# Patient Record
Sex: Male | Born: 1965 | Race: White | Hispanic: No | Marital: Married | Smoking: Never smoker
Health system: Southern US, Community
[De-identification: ages and names within clinical notes are randomized; demographics above are authoritative.]

## PROBLEM LIST (undated history)

## (undated) DIAGNOSIS — Z889 Allergy status to unspecified drugs, medicaments and biological substances status: Secondary | ICD-10-CM

## (undated) DIAGNOSIS — E785 Hyperlipidemia, unspecified: Secondary | ICD-10-CM

## (undated) DIAGNOSIS — I10 Essential (primary) hypertension: Secondary | ICD-10-CM

## (undated) DIAGNOSIS — K219 Gastro-esophageal reflux disease without esophagitis: Secondary | ICD-10-CM

## (undated) HISTORY — PX: HAND SURGERY: SHX662

---

## 2014-09-25 ENCOUNTER — Other Ambulatory Visit: Payer: Self-pay | Admitting: Gastroenterology

## 2014-11-12 ENCOUNTER — Other Ambulatory Visit: Payer: Self-pay | Admitting: Gastroenterology

## 2014-11-25 ENCOUNTER — Encounter (HOSPITAL_COMMUNITY): Admission: RE | Payer: Self-pay | Source: Ambulatory Visit

## 2014-11-25 ENCOUNTER — Ambulatory Visit (HOSPITAL_COMMUNITY)
Admission: RE | Admit: 2014-11-25 | Payer: BLUE CROSS/BLUE SHIELD | Source: Ambulatory Visit | Admitting: Gastroenterology

## 2014-11-25 SURGERY — COLONOSCOPY WITH PROPOFOL
Anesthesia: Monitor Anesthesia Care

## 2015-01-29 ENCOUNTER — Other Ambulatory Visit: Payer: Self-pay | Admitting: Gastroenterology

## 2015-03-05 ENCOUNTER — Encounter (HOSPITAL_COMMUNITY): Payer: Self-pay | Admitting: *Deleted

## 2015-03-18 ENCOUNTER — Ambulatory Visit (HOSPITAL_COMMUNITY): Payer: BLUE CROSS/BLUE SHIELD | Admitting: Anesthesiology

## 2015-03-18 ENCOUNTER — Encounter (HOSPITAL_COMMUNITY): Payer: Self-pay

## 2015-03-18 ENCOUNTER — Ambulatory Visit (HOSPITAL_COMMUNITY)
Admission: RE | Admit: 2015-03-18 | Discharge: 2015-03-18 | Disposition: A | Discharge status: Home / Self Care | Payer: BLUE CROSS/BLUE SHIELD | Source: Ambulatory Visit | Attending: Gastroenterology | Admitting: Gastroenterology

## 2015-03-18 ENCOUNTER — Encounter (HOSPITAL_COMMUNITY)
Admission: RE | Disposition: A | Discharge status: Home / Self Care | Payer: Self-pay | Source: Ambulatory Visit | Attending: Gastroenterology

## 2015-03-18 DIAGNOSIS — E7800 Pure hypercholesterolemia, unspecified: Secondary | ICD-10-CM | POA: Diagnosis not present

## 2015-03-18 DIAGNOSIS — I10 Essential (primary) hypertension: Secondary | ICD-10-CM | POA: Diagnosis not present

## 2015-03-18 DIAGNOSIS — K5730 Diverticulosis of large intestine without perforation or abscess without bleeding: Secondary | ICD-10-CM | POA: Diagnosis not present

## 2015-03-18 DIAGNOSIS — Z1211 Encounter for screening for malignant neoplasm of colon: Secondary | ICD-10-CM | POA: Insufficient documentation

## 2015-03-18 HISTORY — DX: Essential (primary) hypertension: I10

## 2015-03-18 HISTORY — PX: COLONOSCOPY WITH PROPOFOL: SHX5780

## 2015-03-18 HISTORY — DX: Allergy status to unspecified drugs, medicaments and biological substances: Z88.9

## 2015-03-18 SURGERY — COLONOSCOPY WITH PROPOFOL
Anesthesia: Monitor Anesthesia Care

## 2015-03-18 MED ORDER — ONDANSETRON HCL 4 MG/2ML IJ SOLN
INTRAMUSCULAR | Status: DC | PRN
  Administered 2015-03-18: 4 mg via INTRAVENOUS

## 2015-03-18 MED ORDER — ONDANSETRON HCL 4 MG/2ML IJ SOLN
INTRAMUSCULAR | Status: AC
  Filled 2015-03-18: qty 2

## 2015-03-18 MED ORDER — PROPOFOL 10 MG/ML IV BOLUS
INTRAVENOUS | Status: AC
Start: 2015-03-18 — End: 2015-03-18
  Filled 2015-03-18: qty 20

## 2015-03-18 MED ORDER — LIDOCAINE HCL (CARDIAC) 20 MG/ML IV SOLN
INTRAVENOUS | Status: AC
  Filled 2015-03-18: qty 5

## 2015-03-18 MED ORDER — SODIUM CHLORIDE 0.9 % IV SOLN: INTRAVENOUS | Status: DC

## 2015-03-18 MED ORDER — PROPOFOL 10 MG/ML IV BOLUS
INTRAVENOUS | Status: AC
  Filled 2015-03-18: qty 40

## 2015-03-18 MED ORDER — LACTATED RINGERS IV SOLN
INTRAVENOUS | Status: DC
  Administered 2015-03-18: 1000 mL via INTRAVENOUS

## 2015-03-18 MED ORDER — PROPOFOL 500 MG/50ML IV EMUL
INTRAVENOUS | Status: DC | PRN
  Administered 2015-03-18: 175 ug/kg/min via INTRAVENOUS

## 2015-03-18 SURGICAL SUPPLY — 21 items

## 2015-03-18 NOTE — Discharge Instructions (Signed)
Colonoscopy, Care After °Refer to this sheet in the next few weeks. These instructions provide you with information on caring for yourself after your procedure. Your health care provider may also give you more specific instructions. Your treatment has been planned according to current medical practices, but problems sometimes occur. Call your health care provider if you have any problems or questions after your procedure. °WHAT TO EXPECT AFTER THE PROCEDURE  °After your procedure, it is typical to have the following: °· A small amount of blood in your stool. °· Moderate amounts of gas and mild abdominal cramping or bloating. °HOME CARE INSTRUCTIONS °· Do not drive, operate machinery, or sign important documents for 24 hours. °· You may shower and resume your regular physical activities, but move at a slower pace for the first 24 hours. °· Take frequent rest periods for the first 24 hours. °· Walk around or put a warm pack on your abdomen to help reduce abdominal cramping and bloating. °· Drink enough fluids to keep your urine clear or pale yellow. °· You may resume your normal diet as instructed by your health care provider. Avoid heavy or fried foods that are hard to digest. °· Avoid drinking alcohol for 24 hours or as instructed by your health care provider. °· Only take over-the-counter or prescription medicines as directed by your health care provider. °· If a tissue sample (biopsy) was taken during your procedure: °¨ Do not take aspirin or blood thinners for 7 days, or as instructed by your health care provider. °¨ Do not drink alcohol for 7 days, or as instructed by your health care provider. °¨ Eat soft foods for the first 24 hours. °SEEK MEDICAL CARE IF: °You have persistent spotting of blood in your stool 2-3 days after the procedure. °SEEK IMMEDIATE MEDICAL CARE IF: °· You have more than a small spotting of blood in your stool. °· You pass large blood clots in your stool. °· Your abdomen is swollen  (distended). °· You have nausea or vomiting. °· You have a fever. °· You have increasing abdominal pain that is not relieved with medicine. °  °This information is not intended to replace advice given to you by your health care provider. Make sure you discuss any questions you have with your health care provider. °  °Document Released: 08/19/2003 Document Revised: 10/25/2012 Document Reviewed: 09/11/2012 °Elsevier Interactive Patient Education ©2016 [employer] ° °

## 2015-03-18 NOTE — Op Note (Signed)
Procedure: Screening colonoscopy  Endoscopist: Danise Edge  Premedication: Propofol administered by anesthesia  Procedure: The patient was placed in the left lateral decubitus position. Anal inspection and digital rectal exam were normal. The Pentax pediatric colonoscope was introduced into the rectum and advanced to the cecum. A normal-appearing appendiceal orifice and ileocecal valve were identified. Colonic preparation exam today was good. Withdrawal time was 12 minutes  Rectum. Normal. Retroflexed view of the distal rectum was normal  Sigmoid colon and descending colon. Colonic diverticulosis  Splenic flexure. Normal  Transverse colon. Normal  Hepatic flexure. Normal  Ascending colon. Normal  Cecum and ileocecal valve. Normal  Assessment: Normal baseline screening colonoscopy  Recommendation: Schedule repeat screening colonoscopy in 10 years

## 2015-03-18 NOTE — Anesthesia Preprocedure Evaluation (Addendum)
Anesthesia Evaluation  Patient identified by MRN, date of birth, ID band Patient awake    Reviewed: Allergy & Precautions, NPO status , Patient's Chart, lab work & pertinent test results  History of Anesthesia Complications Negative for: history of anesthetic complications  Airway Mallampati: II  TM Distance: >3 FB Neck ROM: Full    Dental  (+) Dental Advisory Given   Pulmonary neg pulmonary ROS,    breath sounds clear to auscultation       Cardiovascular hypertension, Pt. on medications (-) angina Rhythm:Regular Rate:Normal     Neuro/Psych negative neurological ROS     GI/Hepatic negative GI ROS, Neg liver ROS,   Endo/Other  negative endocrine ROS  Renal/GU negative Renal ROS     Musculoskeletal negative musculoskeletal ROS (+)   Abdominal   Peds  Hematology negative hematology ROS (+)   Anesthesia Other Findings   Reproductive/Obstetrics                           Anesthesia Physical Anesthesia Plan  ASA: II  Anesthesia Plan: MAC   Post-op Pain Management:    Induction: Intravenous  Airway Management Planned: Natural Airway and Simple Face Mask  Additional Equipment:   Intra-op Plan:   Post-operative Plan:   Informed Consent: I have reviewed the patients History and Physical, chart, labs and discussed the procedure including the risks, benefits and alternatives for the proposed anesthesia with the patient or authorized representative who has indicated his/her understanding and acceptance.   Dental advisory given  Plan Discussed with: CRNA and Surgeon  Anesthesia Plan Comments: (Plan routine monitors, MAC)        Anesthesia Quick Evaluation

## 2015-03-18 NOTE — Transfer of Care (Signed)
Immediate Anesthesia Transfer of Care Note  Patient: Tracy Goodwin  Procedure(s) Performed: Procedure(s): COLONOSCOPY WITH PROPOFOL (N/A)  Patient Location: PACU  Anesthesia Type:MAC  Level of Consciousness: awake, alert  and patient cooperative  Airway & Oxygen Therapy: Patient Spontanous Breathing  Post-op Assessment: Report given to RN  Post vital signs: Reviewed and stable  Last Vitals:  Filed Vitals:   03/18/15 0903 03/18/15 1116  BP: 150/92 135/85  Pulse: 81 98  Resp: 11 24    Complications: No apparent anesthesia complications

## 2015-03-18 NOTE — Anesthesia Postprocedure Evaluation (Signed)
Anesthesia Post Note  Patient: Tracy Goodwin  Procedure(s) Performed: Procedure(s) (LRB): COLONOSCOPY WITH PROPOFOL (N/A)  Patient location during evaluation: Endoscopy Anesthesia Type: MAC Level of consciousness: awake and alert, oriented and patient cooperative Pain management: pain level controlled Vital Signs Assessment: post-procedure vital signs reviewed and stable Respiratory status: spontaneous breathing, nonlabored ventilation and respiratory function stable Cardiovascular status: blood pressure returned to baseline and stable Postop Assessment: no signs of nausea or vomiting Anesthetic complications: no    Last Vitals:  Filed Vitals:   03/18/15 1120 03/18/15 1130  BP: 138/114 156/94  Pulse:    Temp:    Resp:      Last Pain: There were no vitals filed for this visit.               Germaine Pomfret

## 2015-03-18 NOTE — H&P (Signed)
  Procedure: Baseline screening colonoscopy. No family history of colon cancer.  History: The patient is a 50 year old male born Jan 09, 1966. He is scheduled to undergo a baseline screening colonoscopy  Past medical history: Hypertension. Hypercholesterolemia.  Medication allergies: None  Exam: The patient is alert and lying comfortably on the endoscopy stretcher. Abdomen is soft and nontender to palpation. Lungs are clear to auscultation. Cardiac exam reveals a regular rhythm.  Plan: Proceed with screening colonoscopy

## 2015-03-19 ENCOUNTER — Encounter (HOSPITAL_COMMUNITY): Payer: Self-pay | Admitting: Gastroenterology

## 2015-06-25 DIAGNOSIS — Z125 Encounter for screening for malignant neoplasm of prostate: Secondary | ICD-10-CM | POA: Diagnosis not present

## 2015-06-25 DIAGNOSIS — Z0000 Encounter for general adult medical examination without abnormal findings: Secondary | ICD-10-CM | POA: Diagnosis not present

## 2015-06-25 DIAGNOSIS — Z1389 Encounter for screening for other disorder: Secondary | ICD-10-CM | POA: Diagnosis not present

## 2015-08-30 DIAGNOSIS — R079 Chest pain, unspecified: Secondary | ICD-10-CM | POA: Diagnosis not present

## 2015-12-25 DIAGNOSIS — E782 Mixed hyperlipidemia: Secondary | ICD-10-CM | POA: Diagnosis not present

## 2015-12-25 DIAGNOSIS — K219 Gastro-esophageal reflux disease without esophagitis: Secondary | ICD-10-CM | POA: Diagnosis not present

## 2015-12-25 DIAGNOSIS — J309 Allergic rhinitis, unspecified: Secondary | ICD-10-CM | POA: Diagnosis not present

## 2015-12-25 DIAGNOSIS — I10 Essential (primary) hypertension: Secondary | ICD-10-CM | POA: Diagnosis not present

## 2016-04-27 ENCOUNTER — Emergency Department (HOSPITAL_COMMUNITY): Payer: BLUE CROSS/BLUE SHIELD

## 2016-04-27 ENCOUNTER — Encounter (HOSPITAL_COMMUNITY): Payer: Self-pay | Admitting: Cardiology

## 2016-04-27 ENCOUNTER — Emergency Department (HOSPITAL_COMMUNITY)
Admission: EM | Admit: 2016-04-27 | Discharge: 2016-04-28 | Disposition: A | Discharge status: Home / Self Care | Payer: BLUE CROSS/BLUE SHIELD | Attending: Emergency Medicine | Admitting: Emergency Medicine

## 2016-04-27 DIAGNOSIS — Z7982 Long term (current) use of aspirin: Secondary | ICD-10-CM | POA: Insufficient documentation

## 2016-04-27 DIAGNOSIS — E876 Hypokalemia: Secondary | ICD-10-CM | POA: Diagnosis not present

## 2016-04-27 DIAGNOSIS — R079 Chest pain, unspecified: Secondary | ICD-10-CM | POA: Insufficient documentation

## 2016-04-27 DIAGNOSIS — Z79899 Other long term (current) drug therapy: Secondary | ICD-10-CM | POA: Insufficient documentation

## 2016-04-27 DIAGNOSIS — R0602 Shortness of breath: Secondary | ICD-10-CM | POA: Diagnosis not present

## 2016-04-27 DIAGNOSIS — I10 Essential (primary) hypertension: Secondary | ICD-10-CM | POA: Diagnosis not present

## 2016-04-27 DIAGNOSIS — R208 Other disturbances of skin sensation: Secondary | ICD-10-CM

## 2016-04-27 DIAGNOSIS — R200 Anesthesia of skin: Secondary | ICD-10-CM

## 2016-04-27 DIAGNOSIS — R0600 Dyspnea, unspecified: Secondary | ICD-10-CM

## 2016-04-27 HISTORY — DX: Hyperlipidemia, unspecified: E78.5

## 2016-04-27 LAB — CBC
HCT: 44 % (ref 39.0–52.0)
Hemoglobin: 15.2 g/dL (ref 13.0–17.0)
MCH: 33.6 pg (ref 26.0–34.0)
MCHC: 34.5 g/dL (ref 30.0–36.0)
MCV: 97.3 fL (ref 78.0–100.0)
Platelets: 192 10*3/uL (ref 150–400)
RBC: 4.52 MIL/uL (ref 4.22–5.81)
RDW: 12.7 % (ref 11.5–15.5)
WBC: 8.5 10*3/uL (ref 4.0–10.5)

## 2016-04-27 LAB — TROPONIN I: Troponin I: <0.03 ng/mL (ref <0.03)

## 2016-04-27 LAB — APTT: APTT: 28 s (ref 24–36)

## 2016-04-27 LAB — BASIC METABOLIC PANEL
ANION GAP: 9 (ref 5–15)
BUN: 11 mg/dL (ref 6–20)
CO2: 25 mmol/L (ref 22–32)
CREATININE: 0.88 mg/dL (ref 0.61–1.24)
Calcium: 9.5 mg/dL (ref 8.9–10.3)
Chloride: 101 mmol/L (ref 101–111)
GFR calc Af Amer: >60 mL/min (ref >60)
GFR calc non Af Amer: >60 mL/min (ref >60)
Glucose, Bld: 104 mg/dL — ABNORMAL HIGH (ref 65–99)
Potassium: 3.3 mmol/L — ABNORMAL LOW (ref 3.5–5.1)
SODIUM: 135 mmol/L (ref 135–145)

## 2016-04-27 LAB — PROTIME-INR
INR: 0.99
Prothrombin Time: 13.1 seconds (ref 11.4–15.2)

## 2016-04-27 NOTE — ED Triage Notes (Addendum)
Numbness in left since last night.  Shortness of breath for a while.  c/o some off and on left arm pain.  Denies any at this time.  Worse today.

## 2016-04-27 NOTE — ED Provider Notes (Addendum)
[facility]Y DEPT Provider Note   CSN: [id number] Arrival date & time: 04/27/16  1829    By signing my name below, I, Tracy Goodwin, attest that this documentation has been prepared under the direction and in the presence of Tracy Albe, MD. Electronically Signed: [name], ED Scribe. 04/27/16. 11:41 PM.  Time Seen: 23:26   History   Chief Complaint Chief Complaint  Patient presents with  . Shortness of Breath   The history is provided by the patient and a relative. No language interpreter was used.   HPI Comments: Tracy Goodwin is a 51 y.o. male with PMHx of HTN and hyperlipidemia who presents to the Emergency Department complaining of moderate, constant, outer left-foot numbness onset ~8pm last night. Pt states his numbness radiates up to his left ankle. He describes this sensation as if his foot were asleep. He denies having trouble ambulating. Pt also complains of episodic SOB accompanied with chest soreness that occurred earlier today. Pt states his episodes typically last  ~a minute at a time. It is sore to touch and with movement. He notes his SOB is exacerbated with bending over. He denies having numbness in his left foot digits, he denies tingling. Per pt's sister, she states pt appeared a "gray color to the face" earlier today. Per nurse note, pt complained of left arm pain but states it has subsided. He had it at 6 pm and it lasted 30 minutes.  No alleviating factors noted. Per pt, he reports FMHx of strokes on paternal side (grandfather 62's) and maternal side (aunt 66). Pt denies neck pain, back pain, headache, blurred vision, wheezing, cough, fever. Pt is not a smoker. He notes being a moderate consumer of alcohol, typically drinks 2 drinks per day.  Pt is right handed.   PCP- Dr. Georgann Housekeeper   Past Medical History:  Diagnosis Date  . Hx of seasonal allergies   . Hyperlipidemia   . Hypertension     There are no active problems to display for this  patient.   Past Surgical History:  Procedure Laterality Date  . COLONOSCOPY WITH PROPOFOL N/A 03/18/2015   Procedure: COLONOSCOPY WITH PROPOFOL;  Surgeon: Charolett Bumpers, MD;  Location: [facility];  Service: Endoscopy;  Laterality: N/A;  . HAND SURGERY Left    repair tendon"cut repair"       Home Medications    Prior to Admission medications   Medication Sig Start Date End Date Taking? Authorizing Provider  amLODipine (NORVASC) 5 MG tablet Take 5 mg by mouth daily.   Yes Historical Provider, MD  aspirin EC 81 MG tablet Take 81 mg by mouth daily.   Yes Historical Provider, MD  losartan (COZAAR) 50 MG tablet Take 50 mg by mouth daily.   Yes Historical Provider, MD  pantoprazole (PROTONIX) 40 MG tablet Take 40 mg by mouth daily. 03/19/16  Yes Historical Provider, MD  simvastatin (ZOCOR) 20 MG tablet Take 20 mg by mouth daily at 6 PM.   Yes Historical Provider, MD  potassium chloride 20 MEQ TBCR Take 20 mEq by mouth 2 (two) times daily. 04/28/16 05/03/16  Tracy Albe, MD    Family History History reviewed. No pertinent family history.  Social History Social History  Substance Use Topics  . Smoking status: Never Smoker  . Smokeless tobacco: Not on file  . Alcohol use Yes     Comment: occ.  employed   Allergies   Patient has no known allergies.   Review of Systems Review of Systems  Constitutional: Negative for fever.  Eyes: Negative for visual disturbance.  Respiratory: Positive for shortness of breath. Negative for cough and wheezing.   Cardiovascular: Positive for chest pain (soreness).  Musculoskeletal: Negative for back pain and neck pain.  Neurological: Positive for numbness. Negative for headaches.  All other systems reviewed and are negative.    Physical Exam Updated Vital Signs BP (!) 155/94 (BP Location: Left Arm)   Pulse 99   Temp 98.6 F (37 C) (Oral)   Resp 16   Ht  (1.778 m)   Wt 185 lb (83.9 kg)   SpO2 97%   BMI 26.54 kg/m   Vital signs  normal except hypertension, borderline tachycardia   Physical Exam  Constitutional: He is oriented to person, place, and time. He appears well-developed and well-nourished.  Non-toxic appearance. He does not appear ill. No distress.  HENT:  Head: Normocephalic and atraumatic.  Right Ear: External ear normal.  Left Ear: External ear normal.  Nose: Nose normal. No mucosal edema or rhinorrhea.  Mouth/Throat: Oropharynx is clear and moist and mucous membranes are normal. No dental abscesses or uvula swelling.  Eyes: Conjunctivae and EOM are normal. Pupils are equal, round, and reactive to light.  Neck: Normal range of motion and full passive range of motion without pain. Neck supple.  Cardiovascular: Normal rate, regular rhythm and normal heart sounds.  Exam reveals no gallop and no friction rub.   No murmur heard. Pulmonary/Chest: Effort normal and breath sounds normal. No respiratory distress. He has no wheezes. He has no rhonchi. He has no rales. He exhibits no tenderness and no crepitus.  Abdominal: Soft. Normal appearance and bowel sounds are normal. He exhibits no distension. There is no tenderness. There is no rebound and no guarding.  Musculoskeletal: Normal range of motion. He exhibits no edema or tenderness.  Moves all extremities well.   Neurological: He is alert and oriented to person, place, and time. He has normal strength. No cranial nerve deficit.  Grips are equal. No pronator drift. No lower extremity weakness. Has subjective decreased sensation to light touch just to the lateral left foot but not the toes, medial foot or ankles.    His left hand is cold to touch compared to right with some decreased radial pulse compared tot the right. But he has good pulses in both feet. And there is no temperature difference in both deet.   Skin: Skin is warm, dry and intact. No rash noted. No erythema. No pallor.  Psychiatric: He has a normal mood and affect. His speech is normal and  behavior is normal. His mood appears not anxious.  Nursing note and vitals reviewed.    ED Treatments / Results   DIAGNOSTIC STUDIES: Oxygen Saturation is 97% on RA, normal by my interpretation.    Labs (all labs ordered are listed, but only abnormal results are displayed) Results for orders placed or performed during the hospital encounter of 04/27/16  Basic metabolic panel  Result Value Ref Range   Sodium 135 135 - 145 mmol/L   Potassium 3.3 (L) 3.5 - 5.1 mmol/L   Chloride 101 101 - 111 mmol/L   CO2 25 22 - 32 mmol/L   Glucose, Bld 104 (H) 65 - 99 mg/dL   BUN 11 6 - 20 mg/dL   Creatinine, Ser 9.14 0.61 - 1.24 mg/dL   Calcium 9.5 8.9 - 78.2 mg/dL   GFR calc non Af Amer >60 >60 mL/min   GFR calc Af Amer >60 >60  mL/min   Anion gap 9 5 - 15  CBC  Result Value Ref Range   WBC 8.5 4.0 - 10.5 K/uL   RBC 4.52 4.22 - 5.81 MIL/uL   Hemoglobin 15.2 13.0 - 17.0 g/dL   HCT 16.1 09.6 - 04.5 %   MCV 97.3 78.0 - 100.0 fL   MCH 33.6 26.0 - 34.0 pg   MCHC 34.5 30.0 - 36.0 g/dL   RDW 40.9 81.1 - 91.4 %   Platelets 192 150 - 400 K/uL  Troponin I  Result Value Ref Range   Troponin I <0.03 <0.03 ng/mL  Ethanol  Result Value Ref Range   Alcohol, Ethyl (B) <5 <5 mg/dL  Protime-INR  Result Value Ref Range   Prothrombin Time 13.1 11.4 - 15.2 seconds   INR 0.99   APTT  Result Value Ref Range   aPTT 28 24 - 36 seconds  Urine rapid drug screen (hosp performed)not at [facility]  Result Value Ref Range   Opiates NONE DETECTED NONE DETECTED   Cocaine NONE DETECTED NONE DETECTED   Benzodiazepines NONE DETECTED NONE DETECTED   Amphetamines NONE DETECTED NONE DETECTED   Tetrahydrocannabinol NONE DETECTED NONE DETECTED   Barbiturates NONE DETECTED NONE DETECTED  Urinalysis, Routine w reflex microscopic  Result Value Ref Range   Color, Urine YELLOW YELLOW   APPearance CLEAR CLEAR   Specific Gravity, Urine 1.013 1.005 - 1.030   pH 7.0 5.0 - 8.0   Glucose, UA NEGATIVE NEGATIVE mg/dL   Hgb urine  dipstick NEGATIVE NEGATIVE   Bilirubin Urine NEGATIVE NEGATIVE   Ketones, ur 80 (A) NEGATIVE mg/dL   Protein, ur NEGATIVE NEGATIVE mg/dL   Nitrite NEGATIVE NEGATIVE   Leukocytes, UA NEGATIVE NEGATIVE   Laboratory interpretation all normal except mild hypokalemia    EKG  EKG Interpretation  Date/Time:  Tuesday April 27 2016 18:43:29 EDT Ventricular Rate:  88 PR Interval:  134 QRS Duration: 92 QT Interval:  382 QTC Calculation: 462 R Axis:   -6 Text Interpretation:  Normal sinus rhythm Minimal voltage criteria for LVH, may be normal variant Nonspecific ST abnormality Abnormal ECG No STEMI. No old tracing for comparison.  Confirmed by LONG MD, JOSHUA [phone]) on 04/27/2016 6:53:48 PM       EKG Interpretation  Date/Time:  Tuesday April 27 2016 23:41:06 EDT Ventricular Rate:  88 PR Interval:  134 QRS Duration: 105 QT Interval:  379 QTC Calculation: 459 R Axis:   18 Text Interpretation:  Sinus rhythm Borderline T wave abnormalities Baseline wander No significant change since last tracing EARLIER SAME DATE Confirmed by Sigurd Pugh  MD-I, Argie Lober ([id number]) on 04/28/2016 7:41:55 AM        Radiology Dg Chest 2 View  Result Date: 04/27/2016 CLINICAL DATA:  Mid to left-sided chest numbness.  Left arm pain. EXAM: CHEST  2 VIEW COMPARISON:  None. FINDINGS: Heart size is normal. Mediastinal shadows are normal. There is mild scarring at the left base/ lingula. Remainder the chest is clear. No effusions. No significant bone finding. IMPRESSION: No active disease.  Mild scarring left lung base/ lingula. Electronically Signed   By: Paulina Fusi M.D.   On: 04/27/2016 19:29   Ct Head Wo Contrast  Result Date: 04/28/2016 CLINICAL DATA:  Acute onset of left ankle numbness. Initial encounter. EXAM: CT HEAD WITHOUT CONTRAST TECHNIQUE: Contiguous axial images were obtained from the base of the skull through the vertex without intravenous contrast. COMPARISON:  None. FINDINGS: Brain: No evidence of acute  infarction, hemorrhage, hydrocephalus, extra-axial  collection or mass lesion/mass effect. The posterior fossa, including the cerebellum, brainstem and fourth ventricle, is within normal limits. The third and lateral ventricles, and basal ganglia are unremarkable in appearance. The cerebral hemispheres are symmetric in appearance, with normal gray-white differentiation. No mass effect or midline shift is seen. Vascular: No hyperdense vessel or unexpected calcification. Skull: There is no evidence of fracture; visualized osseous structures are unremarkable in appearance. Sinuses/Orbits: The visualized portions of the orbits are within normal limits. The paranasal sinuses and mastoid air cells are well-aerated. Other: No significant soft tissue abnormalities are seen. IMPRESSION: Unremarkable noncontrast CT of the head. Electronically Signed   By: Roanna Raider M.D.   On: 04/28/2016 00:33    Procedures Procedures (including critical care time)  Medications Ordered in ED Medications - No data to display   Initial Impression / Assessment and Plan / ED Course  I have reviewed the triage vital signs and the nursing notes.  Pertinent labs & imaging results that were available during my care of the patient were reviewed by me and considered in my medical decision making (see chart for details).  COORDINATION OF CARE: 11:35 PM Discussed treatment plan with pt at bedside which includes labs, chest imaging, EKG and pt agreed to plan.    03:47 AM pt states the numbness is gone. We discussed his test results. He will be sent home with potassium supplement and to follow up with his PCP. Etiology of his numbness is unclear. At first I thought he may have had a lacunar infarct which could cause localized numbness. He denies any pain in his back to suggest a radiculopathy. He can be further evaluated by his PCP.  Final Clinical Impressions(s) / ED Diagnoses   Final diagnoses:  Numbness of left foot  Dyspnea,  unspecified type  Hypokalemia    New Prescriptions Discharge Medication List as of 04/28/2016  4:21 AM    START taking these medications   Details  potassium chloride 20 MEQ TBCR Take 20 mEq by mouth 2 (two) times daily., Starting Wed 04/28/2016, Until Mon 05/03/2016, Print        Plan discharge  Tracy Albe, MD, FACEP  I personally performed the services described in this documentation, which was scribed in my presence. The recorded information has been reviewed and considered.  Tracy Albe, MD, Concha Pyo, MD 04/28/16 [id number]    Tracy Albe, MD 04/28/16 [phone]

## 2016-04-28 DIAGNOSIS — R200 Anesthesia of skin: Secondary | ICD-10-CM | POA: Diagnosis not present

## 2016-04-28 LAB — URINALYSIS, ROUTINE W REFLEX MICROSCOPIC
Bilirubin Urine: NEGATIVE
Glucose, UA: NEGATIVE mg/dL
HGB URINE DIPSTICK: NEGATIVE
Ketones, ur: 80 mg/dL — AB
Leukocytes, UA: NEGATIVE
Nitrite: NEGATIVE
PH: 7 (ref 5.0–8.0)
Protein, ur: NEGATIVE mg/dL
SPECIFIC GRAVITY, URINE: 1.013 (ref 1.005–1.030)

## 2016-04-28 LAB — RAPID URINE DRUG SCREEN, HOSP PERFORMED
AMPHETAMINES: NOT DETECTED
BENZODIAZEPINES: NOT DETECTED
Barbiturates: NOT DETECTED
Cocaine: NOT DETECTED
OPIATES: NOT DETECTED
Tetrahydrocannabinol: NOT DETECTED

## 2016-04-28 LAB — ETHANOL

## 2016-04-28 MED ORDER — POTASSIUM CHLORIDE ER 20 MEQ PO TBCR: 20.0000 meq | EXTENDED_RELEASE_TABLET | Freq: Two times a day (BID) | ORAL | 0 refills | Status: DC

## 2016-04-28 NOTE — Discharge Instructions (Signed)
Let Dr Venita Sheffield office know about your ED visit tonight. Return if you get the numbness again.  Recheck if you get chest pain or struggle to breathe. Take the potassium pills until gone.

## 2016-06-28 DIAGNOSIS — E782 Mixed hyperlipidemia: Secondary | ICD-10-CM | POA: Diagnosis not present

## 2016-06-28 DIAGNOSIS — Z1159 Encounter for screening for other viral diseases: Secondary | ICD-10-CM | POA: Diagnosis not present

## 2016-06-28 DIAGNOSIS — R7309 Other abnormal glucose: Secondary | ICD-10-CM | POA: Diagnosis not present

## 2016-06-28 DIAGNOSIS — Z125 Encounter for screening for malignant neoplasm of prostate: Secondary | ICD-10-CM | POA: Diagnosis not present

## 2016-06-28 DIAGNOSIS — Z1389 Encounter for screening for other disorder: Secondary | ICD-10-CM | POA: Diagnosis not present

## 2016-06-28 DIAGNOSIS — Z0000 Encounter for general adult medical examination without abnormal findings: Secondary | ICD-10-CM | POA: Diagnosis not present

## 2016-06-28 DIAGNOSIS — I10 Essential (primary) hypertension: Secondary | ICD-10-CM | POA: Diagnosis not present

## 2016-12-29 DIAGNOSIS — Z23 Encounter for immunization: Secondary | ICD-10-CM | POA: Diagnosis not present

## 2016-12-29 DIAGNOSIS — K219 Gastro-esophageal reflux disease without esophagitis: Secondary | ICD-10-CM | POA: Diagnosis not present

## 2016-12-29 DIAGNOSIS — I10 Essential (primary) hypertension: Secondary | ICD-10-CM | POA: Diagnosis not present

## 2016-12-29 DIAGNOSIS — E782 Mixed hyperlipidemia: Secondary | ICD-10-CM | POA: Diagnosis not present

## 2016-12-29 DIAGNOSIS — J309 Allergic rhinitis, unspecified: Secondary | ICD-10-CM | POA: Diagnosis not present

## 2017-06-29 DIAGNOSIS — K219 Gastro-esophageal reflux disease without esophagitis: Secondary | ICD-10-CM | POA: Diagnosis not present

## 2017-06-29 DIAGNOSIS — E782 Mixed hyperlipidemia: Secondary | ICD-10-CM | POA: Diagnosis not present

## 2017-06-29 DIAGNOSIS — Z1389 Encounter for screening for other disorder: Secondary | ICD-10-CM | POA: Diagnosis not present

## 2017-06-29 DIAGNOSIS — Z0000 Encounter for general adult medical examination without abnormal findings: Secondary | ICD-10-CM | POA: Diagnosis not present

## 2017-06-29 DIAGNOSIS — I10 Essential (primary) hypertension: Secondary | ICD-10-CM | POA: Diagnosis not present

## 2017-08-01 DIAGNOSIS — D72825 Bandemia: Secondary | ICD-10-CM | POA: Diagnosis not present

## 2017-12-28 DIAGNOSIS — K219 Gastro-esophageal reflux disease without esophagitis: Secondary | ICD-10-CM | POA: Diagnosis not present

## 2017-12-28 DIAGNOSIS — Z23 Encounter for immunization: Secondary | ICD-10-CM | POA: Diagnosis not present

## 2017-12-28 DIAGNOSIS — J309 Allergic rhinitis, unspecified: Secondary | ICD-10-CM | POA: Diagnosis not present

## 2017-12-28 DIAGNOSIS — I10 Essential (primary) hypertension: Secondary | ICD-10-CM | POA: Diagnosis not present

## 2017-12-28 DIAGNOSIS — E782 Mixed hyperlipidemia: Secondary | ICD-10-CM | POA: Diagnosis not present

## 2018-07-07 DIAGNOSIS — Z125 Encounter for screening for malignant neoplasm of prostate: Secondary | ICD-10-CM | POA: Diagnosis not present

## 2018-07-07 DIAGNOSIS — E782 Mixed hyperlipidemia: Secondary | ICD-10-CM | POA: Diagnosis not present

## 2018-07-07 DIAGNOSIS — Z1389 Encounter for screening for other disorder: Secondary | ICD-10-CM | POA: Diagnosis not present

## 2018-07-07 DIAGNOSIS — Z0000 Encounter for general adult medical examination without abnormal findings: Secondary | ICD-10-CM | POA: Diagnosis not present

## 2018-07-07 DIAGNOSIS — I10 Essential (primary) hypertension: Secondary | ICD-10-CM | POA: Diagnosis not present

## 2020-03-03 ENCOUNTER — Other Ambulatory Visit (HOSPITAL_COMMUNITY): Payer: Self-pay | Admitting: Family Medicine

## 2020-03-03 DIAGNOSIS — M7989 Other specified soft tissue disorders: Secondary | ICD-10-CM

## 2020-03-03 DIAGNOSIS — M79661 Pain in right lower leg: Secondary | ICD-10-CM

## 2020-03-04 ENCOUNTER — Other Ambulatory Visit: Payer: Self-pay

## 2020-03-04 ENCOUNTER — Ambulatory Visit (HOSPITAL_COMMUNITY)
Admission: RE | Admit: 2020-03-04 | Discharge: 2020-03-04 | Disposition: A | Discharge status: Home / Self Care | Payer: 59 | Source: Ambulatory Visit | Attending: Family Medicine | Admitting: Family Medicine

## 2020-03-04 DIAGNOSIS — M7989 Other specified soft tissue disorders: Secondary | ICD-10-CM | POA: Diagnosis present

## 2020-03-04 DIAGNOSIS — M79661 Pain in right lower leg: Secondary | ICD-10-CM | POA: Diagnosis not present

## 2020-11-10 ENCOUNTER — Other Ambulatory Visit (HOSPITAL_COMMUNITY): Payer: Self-pay | Admitting: Family Medicine

## 2020-11-10 ENCOUNTER — Other Ambulatory Visit: Payer: Self-pay | Admitting: Family Medicine

## 2020-11-10 DIAGNOSIS — M5416 Radiculopathy, lumbar region: Secondary | ICD-10-CM

## 2020-11-11 NOTE — H&P (Signed)
Surgical History & Physical  Patient Name: Tracy Goodwin DOB: 11-27-1965  Surgery: Cataract extraction with intraocular lens implant phacoemulsification; Right Eye  Surgeon: Fabio Pierce MD Surgery Date:  11-13-20 Pre-Op Date:  11-03-20  HPI: [name] is referred by Nedra Hai for cataract eval. 1. 1. The patient complains of difficulty when viewing TV, reading closed caption, news scrolls on TV, which began 5 years ago. The right eye is affected. The episode is gradual. The condition's severity increased since last visit. Symptoms occur when the patient is inside and outside. This is negatively affecting the patient's quality of life. Hx of eye trauma when he was at 7th grade, no eye sx. HPI Completed by Dr. Fabio Pierce  Medical History: High Blood Pressure LDL  Review of Systems Negative Allergic/Immunologic Negative Cardiovascular Negative Constitutional Negative Ear, Nose, Mouth & Throat Negative Endocrine Negative Eyes Negative Gastrointestinal Negative Genitourinary Negative Hemotologic/Lymphatic Negative Integumentary Negative Musculoskeletal Negative Neurological Negative Psychiatry Negative Respiratory  Social   Never smoked   Medication Amlodipine besylate, Losartan Potassium, Pantoprazole, Garlic, Potassium chloride, Simvastatin,   Sx/Procedures  None  Drug Allergies   NKDA  History & Physical: Heent: Cataract, Right Eye NECK: supple without bruits LUNGS: lungs clear to auscultation CV: regular rate and rhythm Abdomen: soft and non-tender  Impression & Plan: Assessment: 1.  COMBINED FORMS AGE RELATED CATARACT; Right Eye (H25.811) 2.  BLEPHARITIS; Right Upper Lid, Right Lower Lid, Left Upper Lid, Left Lower Lid (H01.001, H01.002,H01.004,H01.005) 3.  Pinguecula; Both Eyes (H11.153) 4.  NUCLEAR SCLEROSIS AGE RELATED; Left Eye (H25.12)  Plan: 1.  Cataract accounts for the patient's decreased vision. This visual impairment is not  correctable with a tolerable change in glasses or contact lenses. Cataract surgery with an implantation of a new lens should significantly improve the visual and functional status of the patient. Discussed all risks, benefits, alternatives, and potential complications. Discussed the procedures and recovery. Patient desires to have surgery. A-scan ordered and performed today for intra-ocular lens calculations. The surgery will be performed in order to improve vision for driving, reading, and for eye examinations. Recommend phacoemulsification with intra-ocular lens. Recommend Dextenza for post-operative pain and inflammation. Right Eye. Dilates well - shugarcaine by protocol. History of trauma. Dense cortical - Vision Blue. Consider Vivity lens.  2.  Recommend regular lid cleaning.  3.  Observe; Artificial tears as needed for irritation.  4.  Cataract accounts for the patient's decreased vision. This visual impairment is not correctable with a tolerable change in glasses or contact lenses. Cataract surgery with an implantation of a new lens should significantly improve the visual and functional status of the patient. Discussed all risks, benefits, alternatives, and potential complications. Discussed the procedures and recovery. Patient desires to have surgery. A-scan ordered and performed today for intra-ocular lens calculations. The surgery will be performed in order to improve vision for driving, reading, and for eye examinations. Recommend phacoemulsification with intra-ocular lens. Recommend Dextenza for post-operative pain and inflammation.

## 2020-11-12 ENCOUNTER — Other Ambulatory Visit: Payer: Self-pay

## 2020-11-12 ENCOUNTER — Encounter (HOSPITAL_COMMUNITY)
Admission: RE | Admit: 2020-11-12 | Discharge: 2020-11-12 | Disposition: A | Discharge status: Home / Self Care | Payer: 59 | Source: Ambulatory Visit | Attending: Ophthalmology | Admitting: Ophthalmology

## 2020-11-12 ENCOUNTER — Encounter (HOSPITAL_COMMUNITY): Payer: Self-pay | Admitting: Anesthesiology

## 2020-11-12 ENCOUNTER — Encounter (HOSPITAL_COMMUNITY): Payer: Self-pay

## 2020-11-12 HISTORY — DX: Gastro-esophageal reflux disease without esophagitis: K21.9

## 2020-11-13 ENCOUNTER — Ambulatory Visit (HOSPITAL_COMMUNITY)
Admission: RE | Admit: 2020-11-13 | Discharge: 2020-11-13 | Disposition: A | Discharge status: Home / Self Care | Payer: 59 | Attending: Ophthalmology | Admitting: Ophthalmology

## 2020-11-13 ENCOUNTER — Encounter (HOSPITAL_COMMUNITY)
Admission: RE | Disposition: A | Discharge status: Home / Self Care | Payer: Self-pay | Source: Home / Self Care | Attending: Ophthalmology

## 2020-11-13 SURGERY — PHACOEMULSIFICATION, CATARACT, WITH IOL INSERTION
Anesthesia: Monitor Anesthesia Care | Laterality: Right

## 2020-11-13 MED ORDER — EPINEPHRINE PF 1 MG/ML IJ SOLN
INTRAMUSCULAR | Status: AC
  Filled 2020-11-13: qty 1

## 2020-11-13 SURGICAL SUPPLY — 10 items
CLOTH BEACON ORANGE TIMEOUT ST (SAFETY) IMPLANT
GLOVE SURG UNDER POLY LF SZ6.5 (GLOVE) IMPLANT
GLOVE SURG UNDER POLY LF SZ7 (GLOVE) IMPLANT
NEEDLE HYPO 18GX1.5 BLUNT FILL (NEEDLE) IMPLANT
PAD ARMBOARD 7.5X6 YLW CONV (MISCELLANEOUS) IMPLANT
RING MALYGIN (MISCELLANEOUS) IMPLANT
RING MALYGIN 7.0 (MISCELLANEOUS) IMPLANT
SIGHTPATH CAT PROC W REG LENS (Ophthalmic Related) IMPLANT
SYR TB 1ML LL NO SAFETY (SYRINGE) IMPLANT
WATER STERILE IRR 250ML POUR (IV SOLUTION) IMPLANT

## 2020-11-13 NOTE — OR Nursing (Signed)
Patient procedure was cancelled  due to the lens needed was not available.

## 2020-11-28 ENCOUNTER — Other Ambulatory Visit: Payer: Self-pay

## 2020-11-28 ENCOUNTER — Encounter (HOSPITAL_COMMUNITY)
Admit: 2020-11-28 | Discharge: 2020-11-28 | Disposition: A | Discharge status: Home / Self Care | Payer: 59 | Attending: Ophthalmology | Admitting: Ophthalmology

## 2020-11-28 NOTE — Pre-Procedure Instructions (Signed)
Attempted to call patient for pre-op. Left message on home phone. Cell phone had no voicemail.

## 2020-12-02 NOTE — H&P (Signed)
Surgical History & Physical  Patient Name: Tracy Goodwin DOB: [birth year]  Surgery: Cataract extraction with intraocular lens implant phacoemulsification; Right Eye  Surgeon: Fabio Pierce MD Surgery Date:  12-05-20 Pre-Op Date:  11-27-20  HPI: A 89 Yr. old male patient is referred by Nedra Hai for cataract eval. 1. 1. The patient complains of difficulty when viewing TV, reading closed caption, news scrolls on TV, which began 5 years ago. The right eye is affected. The episode is gradual. The condition's severity increased since last visit. Symptoms occur when the patient is inside and outside. This is negatively affecting the patient's quality of life. Hx of eye trauma when he was at 7th grade, no eye sx. HPI Completed by Dr. Fabio Pierce  Medical History:  High Blood Pressure LDL    Review of Systems Negative Allergic/Immunologic Negative Cardiovascular Negative Constitutional Negative Ear, Nose, Mouth & Throat Negative Endocrine Negative Eyes Negative Gastrointestinal Negative Genitourinary Negative Hemotologic/Lymphatic Negative Integumentary Negative Musculoskeletal Negative Neurological Negative Psychiatry Negative Respiratory  Social   Never smoked   Medication Amlodipine besylate, Losartan Potassium, Pantoprazole, Garlic, Potassium chloride, Simvastatin,   Sx/Procedures  None   Drug Allergies   NKDA  History & Physical: Heent: Cataract, Right Eye NECK: supple without bruits LUNGS: lungs clear to auscultation CV: regular rate and rhythm Abdomen: soft and non-tender  Impression & Plan: Assessment: 1.  COMBINED FORMS AGE RELATED CATARACT; Right Eye (H25.811) 2.  BLEPHARITIS; Right Upper Lid, Right Lower Lid, Left Upper Lid, Left Lower Lid (H01.001, H01.002,H01.004,H01.005) 3.  Pinguecula; Both Eyes (H11.153) 4.  NUCLEAR SCLEROSIS AGE RELATED; Left Eye (H25.12)  Plan: 1.  Cataract accounts for the patient's decreased vision. This visual impairment is not  correctable with a tolerable change in glasses or contact lenses. Cataract surgery with an implantation of a new lens should significantly improve the visual and functional status of the patient. Discussed all risks, benefits, alternatives, and potential complications. Discussed the procedures and recovery. Patient desires to have surgery. A-scan ordered and performed today for intra-ocular lens calculations. The surgery will be performed in order to improve vision for driving, reading, and for eye examinations. Recommend phacoemulsification with intra-ocular lens. Recommend Dextenza for post-operative pain and inflammation. Right Eye. Dilates well - shugarcaine by protocol. History of trauma. Dense cortical - Vision Blue. Consider Vivity lens.  2.  Recommend regular lid cleaning.  3.  Observe; Artificial tears as needed for irritation.  4.  Cataract accounts for the patient's decreased vision. This visual impairment is not correctable with a tolerable change in glasses or contact lenses. Cataract surgery with an implantation of a new lens should significantly improve the visual and functional status of the patient. Discussed all risks, benefits, alternatives, and potential complications. Discussed the procedures and recovery. Patient desires to have surgery. A-scan ordered and performed today for intra-ocular lens calculations. The surgery will be performed in order to improve vision for driving, reading, and for eye examinations. Recommend phacoemulsification with intra-ocular lens. Recommend Dextenza for post-operative pain and inflammation.

## 2020-12-04 ENCOUNTER — Other Ambulatory Visit: Payer: Self-pay

## 2020-12-04 ENCOUNTER — Ambulatory Visit (HOSPITAL_COMMUNITY)
Admission: RE | Admit: 2020-12-04 | Discharge: 2020-12-04 | Disposition: A | Discharge status: Home / Self Care | Payer: 59 | Source: Ambulatory Visit | Attending: Family Medicine | Admitting: Family Medicine

## 2020-12-04 DIAGNOSIS — M5416 Radiculopathy, lumbar region: Secondary | ICD-10-CM | POA: Diagnosis present

## 2020-12-05 ENCOUNTER — Other Ambulatory Visit: Payer: Self-pay

## 2020-12-05 ENCOUNTER — Ambulatory Visit (HOSPITAL_COMMUNITY): Payer: 59 | Admitting: Certified Registered"

## 2020-12-05 ENCOUNTER — Ambulatory Visit (HOSPITAL_COMMUNITY)
Admission: RE | Admit: 2020-12-05 | Discharge: 2020-12-05 | Disposition: A | Discharge status: Home / Self Care | Payer: 59 | Attending: Ophthalmology | Admitting: Ophthalmology

## 2020-12-05 ENCOUNTER — Encounter (HOSPITAL_COMMUNITY): Payer: Self-pay | Admitting: Ophthalmology

## 2020-12-05 ENCOUNTER — Encounter (HOSPITAL_COMMUNITY)
Admission: RE | Disposition: A | Discharge status: Home / Self Care | Payer: Self-pay | Source: Home / Self Care | Attending: Ophthalmology

## 2020-12-05 DIAGNOSIS — I10 Essential (primary) hypertension: Secondary | ICD-10-CM | POA: Insufficient documentation

## 2020-12-05 DIAGNOSIS — H11153 Pinguecula, bilateral: Secondary | ICD-10-CM | POA: Diagnosis not present

## 2020-12-05 DIAGNOSIS — K219 Gastro-esophageal reflux disease without esophagitis: Secondary | ICD-10-CM | POA: Diagnosis not present

## 2020-12-05 DIAGNOSIS — H25811 Combined forms of age-related cataract, right eye: Secondary | ICD-10-CM | POA: Insufficient documentation

## 2020-12-05 DIAGNOSIS — H2512 Age-related nuclear cataract, left eye: Secondary | ICD-10-CM | POA: Insufficient documentation

## 2020-12-05 DIAGNOSIS — H0100B Unspecified blepharitis left eye, upper and lower eyelids: Secondary | ICD-10-CM | POA: Diagnosis not present

## 2020-12-05 DIAGNOSIS — H0100A Unspecified blepharitis right eye, upper and lower eyelids: Secondary | ICD-10-CM | POA: Diagnosis not present

## 2020-12-05 HISTORY — PX: CATARACT EXTRACTION W/PHACO: SHX586

## 2020-12-05 SURGERY — PHACOEMULSIFICATION, CATARACT, WITH IOL INSERTION
Anesthesia: Monitor Anesthesia Care | Site: Eye | Laterality: Right

## 2020-12-05 MED ORDER — TRYPAN BLUE 0.06 % IO SOSY
PREFILLED_SYRINGE | INTRAOCULAR | Status: AC
  Filled 2020-12-05: qty 0.5

## 2020-12-05 MED ORDER — STERILE WATER FOR IRRIGATION IR SOLN
Status: DC | PRN
  Administered 2020-12-05: 250 mL

## 2020-12-05 MED ORDER — POVIDONE-IODINE 5 % OP SOLN
OPHTHALMIC | Status: DC | PRN
  Administered 2020-12-05: 1 via OPHTHALMIC

## 2020-12-05 MED ORDER — TRYPAN BLUE 0.06 % IO SOSY
PREFILLED_SYRINGE | INTRAOCULAR | Status: DC | PRN
  Administered 2020-12-05: 0.5 mL via INTRAOCULAR

## 2020-12-05 MED ORDER — TROPICAMIDE 1 % OP SOLN
1.0000 [drp] | OPHTHALMIC | Status: AC | PRN
  Administered 2020-12-05 (×3): 1 [drp] via OPHTHALMIC

## 2020-12-05 MED ORDER — LIDOCAINE HCL (PF) 1 % IJ SOLN
INTRAOCULAR | Status: DC | PRN
  Administered 2020-12-05: 1 mL via OPHTHALMIC

## 2020-12-05 MED ORDER — EPINEPHRINE PF 1 MG/ML IJ SOLN
INTRAOCULAR | Status: DC | PRN
  Administered 2020-12-05: 500 mL

## 2020-12-05 MED ORDER — SODIUM CHLORIDE 0.9% FLUSH
INTRAVENOUS | Status: DC | PRN
  Administered 2020-12-05 (×2): 5 mL via INTRAVENOUS

## 2020-12-05 MED ORDER — BSS IO SOLN
INTRAOCULAR | Status: DC | PRN
  Administered 2020-12-05: 15 mL via INTRAOCULAR

## 2020-12-05 MED ORDER — TETRACAINE HCL 0.5 % OP SOLN
1.0000 [drp] | OPHTHALMIC | Status: AC | PRN
  Administered 2020-12-05 (×3): 1 [drp] via OPHTHALMIC

## 2020-12-05 MED ORDER — EPINEPHRINE PF 1 MG/ML IJ SOLN
INTRAMUSCULAR | Status: AC
  Filled 2020-12-05: qty 2

## 2020-12-05 MED ORDER — MIDAZOLAM HCL 2 MG/2ML IJ SOLN
INTRAMUSCULAR | Status: DC | PRN
  Administered 2020-12-05 (×2): 1 mg via INTRAVENOUS

## 2020-12-05 MED ORDER — SODIUM HYALURONATE 23MG/ML IO SOSY
PREFILLED_SYRINGE | INTRAOCULAR | Status: DC | PRN
  Administered 2020-12-05: 0.6 mL via INTRAOCULAR

## 2020-12-05 MED ORDER — SODIUM HYALURONATE 10 MG/ML IO SOLUTION
PREFILLED_SYRINGE | INTRAOCULAR | Status: DC | PRN
  Administered 2020-12-05: 0.85 mL via INTRAOCULAR

## 2020-12-05 MED ORDER — MIDAZOLAM HCL 2 MG/2ML IJ SOLN
INTRAMUSCULAR | Status: AC
  Filled 2020-12-05: qty 2

## 2020-12-05 MED ORDER — PHENYLEPHRINE HCL 2.5 % OP SOLN
1.0000 [drp] | OPHTHALMIC | Status: AC | PRN
  Administered 2020-12-05 (×3): 1 [drp] via OPHTHALMIC

## 2020-12-05 MED ORDER — NEOMYCIN-POLYMYXIN-DEXAMETH 3.5-10000-0.1 OP SUSP
OPHTHALMIC | Status: DC | PRN
  Administered 2020-12-05: 1 [drp] via OPHTHALMIC

## 2020-12-05 MED ORDER — SODIUM CHLORIDE (PF) 0.9 % IJ SOLN
INTRAMUSCULAR | Status: AC
  Filled 2020-12-05: qty 10

## 2020-12-05 MED ORDER — LIDOCAINE HCL 3.5 % OP GEL: 1.0000 "application " | Freq: Once | OPHTHALMIC | Status: DC

## 2020-12-05 SURGICAL SUPPLY — 12 items
CLOTH BEACON ORANGE TIMEOUT ST (SAFETY) ×2 IMPLANT
EYE SHIELD UNIVERSAL CLEAR (GAUZE/BANDAGES/DRESSINGS) ×2 IMPLANT
GLOVE SURG UNDER POLY LF SZ7 (GLOVE) ×6 IMPLANT
GOWN STRL REUS W/ TWL LRG LVL3 (GOWN DISPOSABLE) ×1 IMPLANT
GOWN STRL REUS W/TWL LRG LVL3 (GOWN DISPOSABLE) ×1
NEEDLE HYPO 18GX1.5 BLUNT FILL (NEEDLE) ×2 IMPLANT
PAD ARMBOARD 7.5X6 YLW CONV (MISCELLANEOUS) ×2 IMPLANT
RayOne EMV US (Intraocular Lens) ×2 IMPLANT
SYR TB 1ML LL NO SAFETY (SYRINGE) ×2 IMPLANT
TAPE SURG TRANSPORE 1 IN (GAUZE/BANDAGES/DRESSINGS) ×1 IMPLANT
TAPE SURGICAL TRANSPORE 1 IN (GAUZE/BANDAGES/DRESSINGS) ×1
WATER STERILE IRR 250ML POUR (IV SOLUTION) ×2 IMPLANT

## 2020-12-05 NOTE — Op Note (Signed)
Date of procedure: 12/05/20  Pre-operative diagnosis:  Visually significant combined form age-related cataract, Right Eye (H25.811)  Post-operative diagnosis:  Visually significant combined form age-related cataract, Right Eye (H25.811)  Procedure: Removal of cataract via phacoemulsification and insertion of intra-ocular lens Rayner [id number] +19.5D into the capsular bag of the Right Eye  Attending surgeon: Gerda Diss. Kacyn Souder, MD, MA  Anesthesia: MAC, Topical Akten  Complications: None  Estimated Blood Loss: <71m (minimal)  Specimens: None  Implants: As above  Indications:  Visually significant age-related cataract, Right Eye  Procedure:  The patient was seen and identified in the pre-operative area. The operative eye was identified and dilated.  The operative eye was marked.  Topical anesthesia was administered to the operative eye.     The patient was then to the operative suite and placed in the supine position.  A timeout was performed confirming the patient, procedure to be performed, and all other relevant information.   The patient's face was prepped and draped in the usual fashion for intra-ocular surgery.  A lid speculum was placed into the operative eye and the surgical microscope moved into place and focused.  A superotemporal paracentesis was created using a 20 gauge paracentesis blade.  Vision Blue was used to stain the anterior capsule. Shugarcaine was injected into the anterior chamber.  Viscoelastic was injected into the anterior chamber.  A temporal clear-corneal main wound incision was created using a 2.430mmicrokeratome.  A continuous curvilinear capsulorrhexis was initiated using an irrigating cystitome and completed using capsulorrhexis forceps.  Hydrodissection and hydrodeliniation were performed.  Viscoelastic was injected into the anterior chamber.  A phacoemulsification handpiece and a chopper as a second instrument were used to remove the nucleus and epinucleus. The  irrigation/aspiration handpiece was used to remove any remaining cortical material.   The capsular bag was reinflated with viscoelastic, checked, and found to be intact.  The intraocular lens was inserted into the capsular bag.  The irrigation/aspiration handpiece was used to remove any remaining viscoelastic.  The clear corneal wound and paracentesis wounds were then hydrated and checked with Weck-Cels to be watertight.  The lid-speculum was removed.  The drape was removed.  The patient's face was cleaned with a wet and dry 4x4.   Maxitrol was instilled in the eye. A clear shield was taped over the eye. The patient was taken to the post-operative care unit in good condition, having tolerated the procedure well.  Post-Op Instructions: [facility] and will receive all other orders and instructions.

## 2020-12-05 NOTE — Discharge Instructions (Addendum)
Please discharge patient when stable, will follow up today with Dr. Wrzosek at the [facility] office immediately following discharge.  Leave shield in place until visit.  All paperwork with discharge instructions will be given at the office.  [facility] Address:  [address]  [place], [place] [address]  

## 2020-12-05 NOTE — Transfer of Care (Signed)
Immediate Anesthesia Transfer of Care Note  Patient: Tracy Goodwin  Procedure(s) Performed: CATARACT EXTRACTION PHACO AND INTRAOCULAR LENS PLACEMENT (IOC) (Right: Eye)  Patient Location: Short Stay  Anesthesia Type:MAC  Level of Consciousness: awake, alert  and oriented  Airway & Oxygen Therapy: Patient Spontanous Breathing  Post-op Assessment: Report given to RN and Post -op Vital signs reviewed and stable  Post vital signs: Reviewed and stable  Last Vitals:  Vitals Value Taken Time  BP    Temp    Pulse    Resp    SpO2      Last Pain:  Vitals:   12/05/20 0842  TempSrc: Oral  PainSc: 0-No pain         Complications: No notable events documented.

## 2020-12-05 NOTE — Anesthesia Procedure Notes (Signed)
Procedure Name: MAC Date/Time: 12/05/2020 9:28 AM Performed by: Orlie Dakin, CRNA Pre-anesthesia Checklist: Patient identified, Emergency Drugs available, Suction available and Patient being monitored Oxygen Delivery Method: Nasal cannula Placement Confirmation: positive ETCO2

## 2020-12-05 NOTE — Anesthesia Postprocedure Evaluation (Signed)
Anesthesia Post Note  Patient: Tracy Goodwin  Procedure(s) Performed: CATARACT EXTRACTION PHACO AND INTRAOCULAR LENS PLACEMENT (IOC) (Right: Eye)  Patient location during evaluation: Phase II Anesthesia Type: MAC Level of consciousness: awake Pain management: pain level controlled Vital Signs Assessment: post-procedure vital signs reviewed and stable Respiratory status: spontaneous breathing and respiratory function stable Cardiovascular status: blood pressure returned to baseline and stable Postop Assessment: no headache and no apparent nausea or vomiting Anesthetic complications: no Comments: Late entry   No notable events documented.   Last Vitals:  Vitals:   12/05/20 0846 12/05/20 0946  BP: 127/74 127/90  Pulse:  74  Resp:  18  Temp:  36.9 C  SpO2: 97% 95%    Last Pain:  Vitals:   12/05/20 0946  TempSrc: Oral  PainSc:                  Louann Sjogren

## 2020-12-05 NOTE — Anesthesia Preprocedure Evaluation (Signed)
Anesthesia Evaluation  Patient identified by MRN, date of birth, ID band Patient awake    Reviewed: Allergy & Precautions, H&P , NPO status , Patient's Chart, lab work & pertinent test results, reviewed documented beta blocker date and time   Airway Mallampati: II  TM Distance: >3 FB Neck ROM: full    Dental no notable dental hx.    Pulmonary neg pulmonary ROS,    Pulmonary exam normal breath sounds clear to auscultation       Cardiovascular Exercise Tolerance: Good hypertension, negative cardio ROS   Rhythm:regular Rate:Normal     Neuro/Psych negative neurological ROS  negative psych ROS   GI/Hepatic Neg liver ROS, GERD  Medicated,  Endo/Other  negative endocrine ROS  Renal/GU negative Renal ROS  negative genitourinary   Musculoskeletal   Abdominal   Peds  Hematology negative hematology ROS (+)   Anesthesia Other Findings   Reproductive/Obstetrics negative OB ROS                             Anesthesia Physical Anesthesia Plan  ASA: 2  Anesthesia Plan: MAC   Post-op Pain Management:    Induction:   PONV Risk Score and Plan:   Airway Management Planned:   Additional Equipment:   Intra-op Plan:   Post-operative Plan:   Informed Consent: I have reviewed the patients History and Physical, chart, labs and discussed the procedure including the risks, benefits and alternatives for the proposed anesthesia with the patient or authorized representative who has indicated his/her understanding and acceptance.     Dental Advisory Given  Plan Discussed with: CRNA  Anesthesia Plan Comments:         Anesthesia Quick Evaluation  

## 2020-12-05 NOTE — Interval H&P Note (Signed)
History and Physical Interval Note:  12/05/2020 9:16 AM  Tracy Goodwin  has presented today for surgery, with the diagnosis of nuclear cataract right eye.  The various methods of treatment have been discussed with the patient and family. After consideration of risks, benefits and other options for treatment, the patient has consented to  Procedure(s) with comments: CATARACT EXTRACTION PHACO AND INTRAOCULAR LENS PLACEMENT (IOC) (Right) - right as a surgical intervention.  The patient's history has been reviewed, patient examined, no change in status, stable for surgery.  I have reviewed the patient's chart and labs.  Questions were answered to the patient's satisfaction.     Fabio Pierce

## 2020-12-08 ENCOUNTER — Encounter (HOSPITAL_COMMUNITY): Payer: Self-pay | Admitting: Ophthalmology

## 2021-03-11 DIAGNOSIS — I10 Essential (primary) hypertension: Secondary | ICD-10-CM | POA: Diagnosis not present

## 2021-03-11 DIAGNOSIS — J309 Allergic rhinitis, unspecified: Secondary | ICD-10-CM | POA: Diagnosis not present

## 2021-03-11 DIAGNOSIS — E782 Mixed hyperlipidemia: Secondary | ICD-10-CM | POA: Diagnosis not present

## 2021-03-11 DIAGNOSIS — K219 Gastro-esophageal reflux disease without esophagitis: Secondary | ICD-10-CM | POA: Diagnosis not present

## 2021-07-31 DIAGNOSIS — R718 Other abnormality of red blood cells: Secondary | ICD-10-CM | POA: Diagnosis not present

## 2021-07-31 DIAGNOSIS — Z125 Encounter for screening for malignant neoplasm of prostate: Secondary | ICD-10-CM | POA: Diagnosis not present

## 2021-07-31 DIAGNOSIS — Z79899 Other long term (current) drug therapy: Secondary | ICD-10-CM | POA: Diagnosis not present

## 2021-07-31 DIAGNOSIS — Z5181 Encounter for therapeutic drug level monitoring: Secondary | ICD-10-CM | POA: Diagnosis not present

## 2021-07-31 DIAGNOSIS — E782 Mixed hyperlipidemia: Secondary | ICD-10-CM | POA: Diagnosis not present

## 2021-07-31 DIAGNOSIS — I10 Essential (primary) hypertension: Secondary | ICD-10-CM | POA: Diagnosis not present

## 2021-07-31 DIAGNOSIS — Z0000 Encounter for general adult medical examination without abnormal findings: Secondary | ICD-10-CM | POA: Diagnosis not present

## 2022-02-03 DIAGNOSIS — I10 Essential (primary) hypertension: Secondary | ICD-10-CM | POA: Diagnosis not present

## 2022-02-03 DIAGNOSIS — J309 Allergic rhinitis, unspecified: Secondary | ICD-10-CM | POA: Diagnosis not present

## 2022-02-03 DIAGNOSIS — Z5181 Encounter for therapeutic drug level monitoring: Secondary | ICD-10-CM | POA: Diagnosis not present

## 2022-02-03 DIAGNOSIS — E782 Mixed hyperlipidemia: Secondary | ICD-10-CM | POA: Diagnosis not present

## 2022-02-03 DIAGNOSIS — K219 Gastro-esophageal reflux disease without esophagitis: Secondary | ICD-10-CM | POA: Diagnosis not present

## 2022-07-20 DIAGNOSIS — M722 Plantar fascial fibromatosis: Secondary | ICD-10-CM | POA: Diagnosis not present

## 2022-07-20 DIAGNOSIS — M79671 Pain in right foot: Secondary | ICD-10-CM | POA: Diagnosis not present

## 2022-08-02 DIAGNOSIS — I10 Essential (primary) hypertension: Secondary | ICD-10-CM | POA: Diagnosis not present

## 2022-08-02 DIAGNOSIS — E782 Mixed hyperlipidemia: Secondary | ICD-10-CM | POA: Diagnosis not present

## 2022-08-02 DIAGNOSIS — Z125 Encounter for screening for malignant neoplasm of prostate: Secondary | ICD-10-CM | POA: Diagnosis not present

## 2022-08-02 DIAGNOSIS — K219 Gastro-esophageal reflux disease without esophagitis: Secondary | ICD-10-CM | POA: Diagnosis not present

## 2022-08-02 DIAGNOSIS — R7309 Other abnormal glucose: Secondary | ICD-10-CM | POA: Diagnosis not present

## 2022-08-02 DIAGNOSIS — Z0000 Encounter for general adult medical examination without abnormal findings: Secondary | ICD-10-CM | POA: Diagnosis not present

## 2022-08-02 DIAGNOSIS — Z13220 Encounter for screening for lipoid disorders: Secondary | ICD-10-CM | POA: Diagnosis not present

## 2022-08-04 ENCOUNTER — Other Ambulatory Visit (HOSPITAL_COMMUNITY): Payer: Self-pay | Admitting: Internal Medicine

## 2022-08-04 DIAGNOSIS — Z8249 Family history of ischemic heart disease and other diseases of the circulatory system: Secondary | ICD-10-CM

## 2022-08-04 DIAGNOSIS — E782 Mixed hyperlipidemia: Secondary | ICD-10-CM

## 2022-08-26 ENCOUNTER — Ambulatory Visit (HOSPITAL_COMMUNITY): Payer: Self-pay

## 2022-09-08 ENCOUNTER — Ambulatory Visit (HOSPITAL_COMMUNITY)
Admission: RE | Admit: 2022-09-08 | Discharge: 2022-09-08 | Disposition: A | Discharge status: Home / Self Care | Payer: Self-pay | Source: Ambulatory Visit | Attending: Internal Medicine | Admitting: Internal Medicine

## 2022-09-08 DIAGNOSIS — Z8249 Family history of ischemic heart disease and other diseases of the circulatory system: Secondary | ICD-10-CM | POA: Insufficient documentation

## 2022-09-08 DIAGNOSIS — E782 Mixed hyperlipidemia: Secondary | ICD-10-CM | POA: Insufficient documentation

## 2022-09-16 IMAGING — MR MR LUMBAR SPINE W/O CM
5 series · 31 of 48 positions shown · non-contrast
Comparison: None.

CLINICAL DATA: Low back pain radiating down bilateral legs for 6
weeks.

EXAM:
MRI LUMBAR SPINE WITHOUT CONTRAST
TECHNIQUE: Multiplanar, multisequence MR imaging of the lumbar spine was
performed. No intravenous contrast was administered.

[Series 5: T2 · sagittal · 4.0mm · 0.68mm/px · 6 of 16 slices shown (1 of 2)]
[im 1/16]
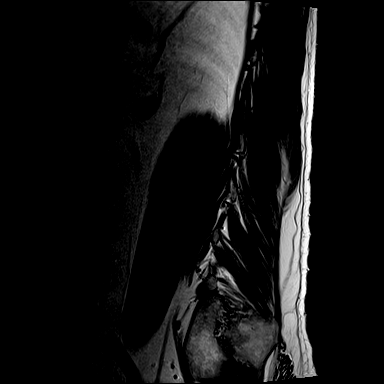
[im 4/16]
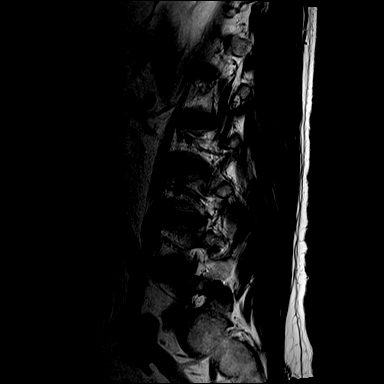
[im 7/16]
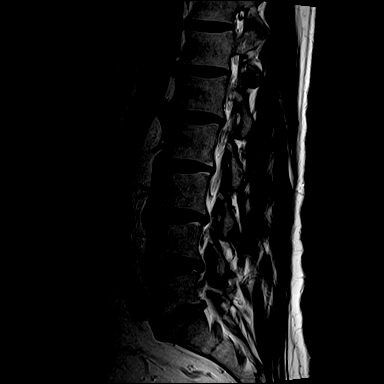
[im 10/16]
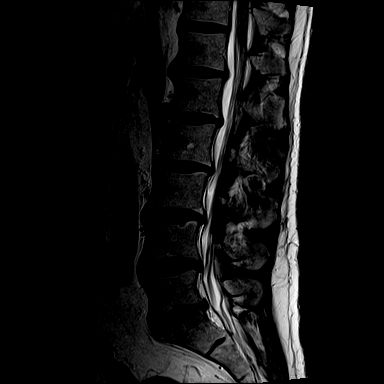
[im 13/16]
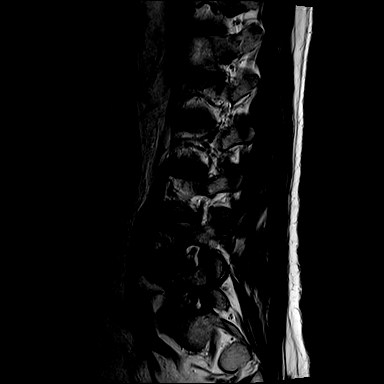
[im 16/16]
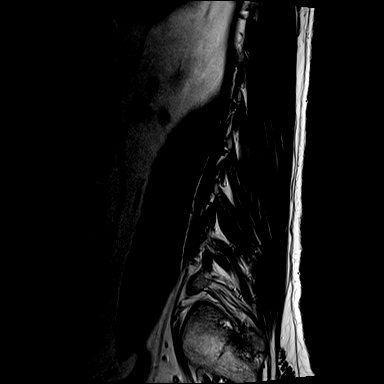

[Series 6: T1 · sagittal · 4.0mm · 0.81mm/px · 7 of 15 slices shown (1 of 2)]
[im 1/15]
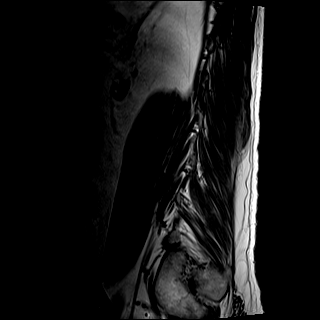
[im 3/15]
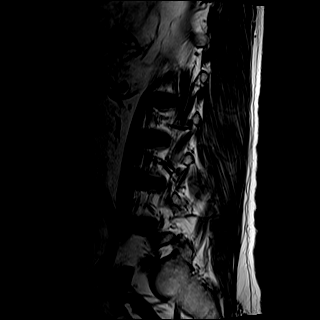
[im 5/15]
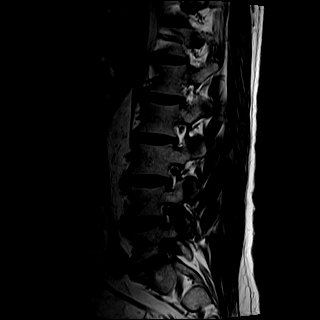
[im 8/15]
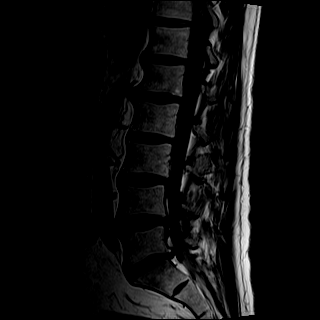
[im 10/15]
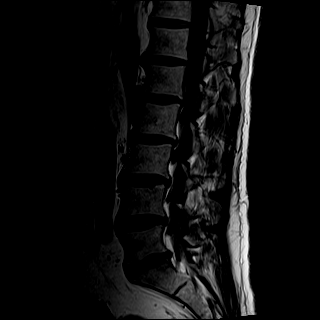
[im 12/15]
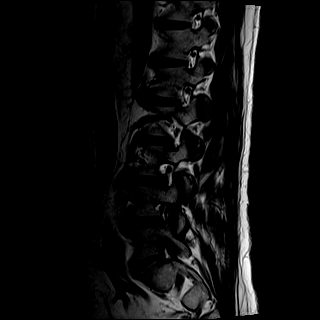
[im 15/15]
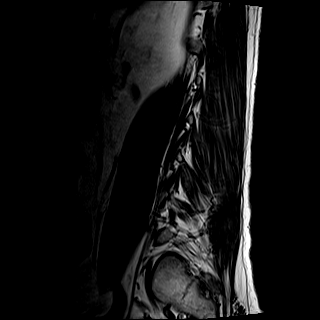

[Series 7: STIR · sagittal · 4.0mm · 0.51mm/px · 2 of 15 slices shown]
[im 1/15]
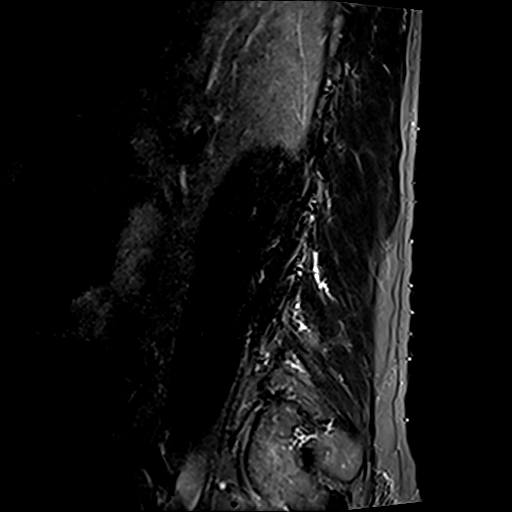
[im 3/15]
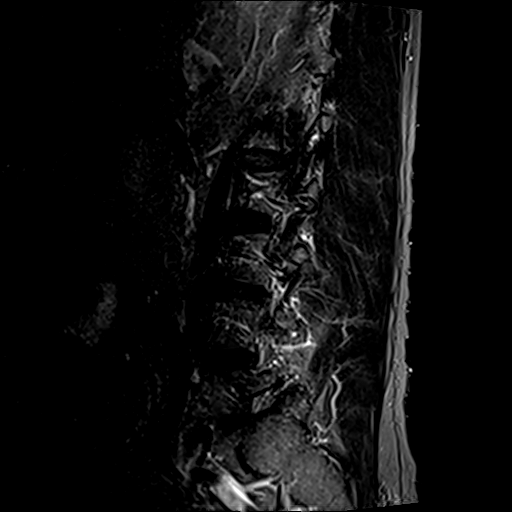

[Series 8: T2 · axial · 4.0mm · 0.70mm/px · z∈[-139,+35]mm · 8 of 31 slices shown (2 of 2)]
[im 1/31]
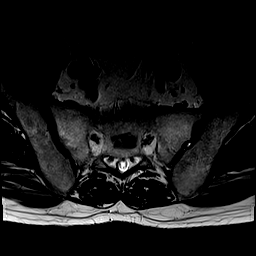
[im 5/31]
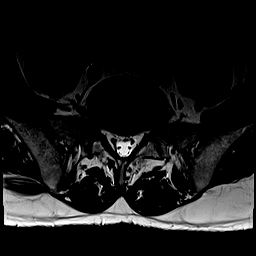
[im 10/31]
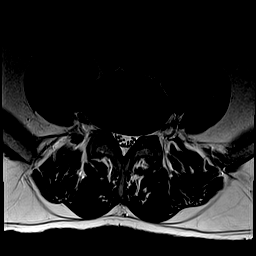
[im 14/31]
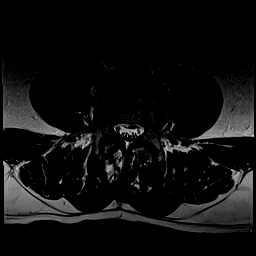
[im 17/31]
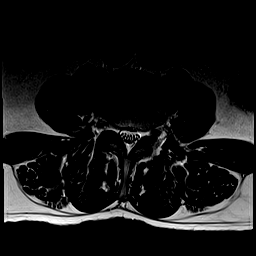
[im 21/31]
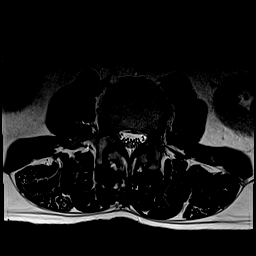
[im 26/31]
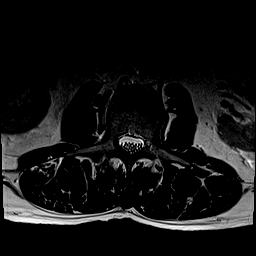
[im 31/31]
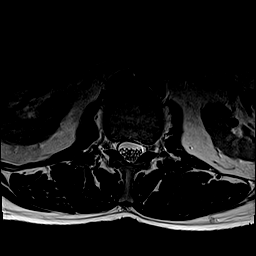

[Series 9: T1 · axial · 4.0mm · 0.35mm/px · z∈[-139,+35]mm · 8 of 31 slices shown (2 of 2)]
[im 1/31]
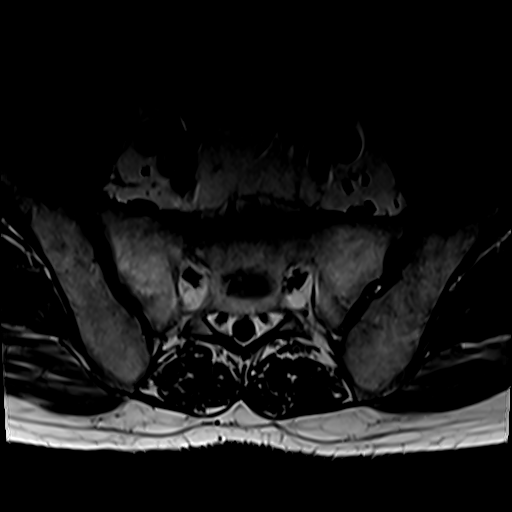
[im 5/31]
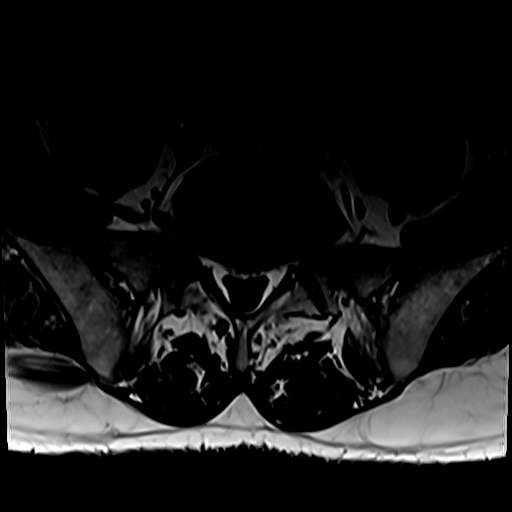
[im 10/31]
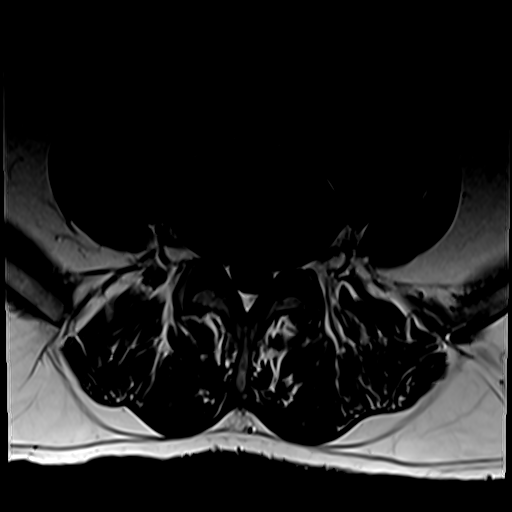
[im 14/31]
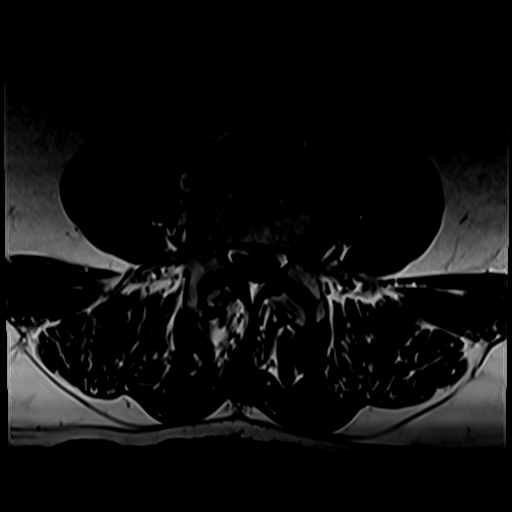
[im 17/31]
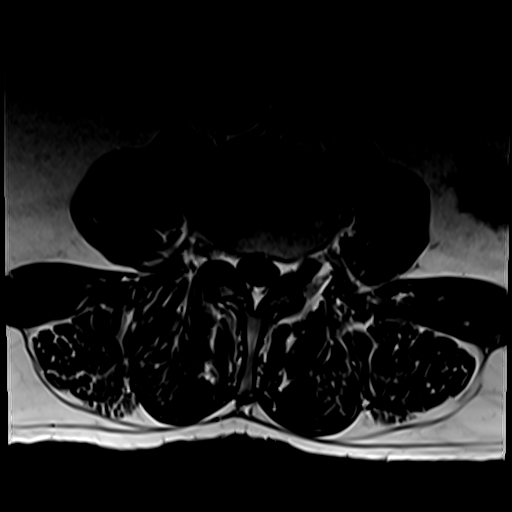
[im 21/31]
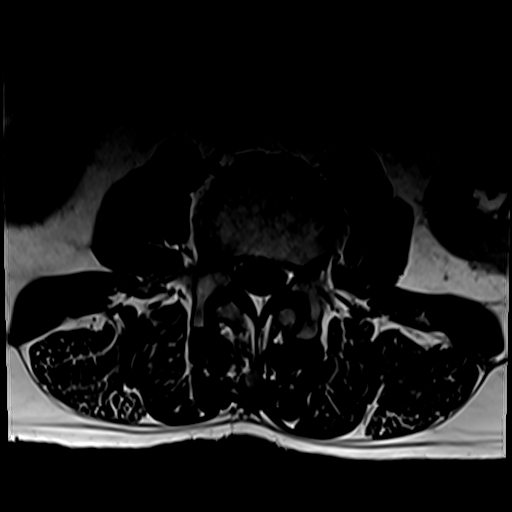
[im 26/31]
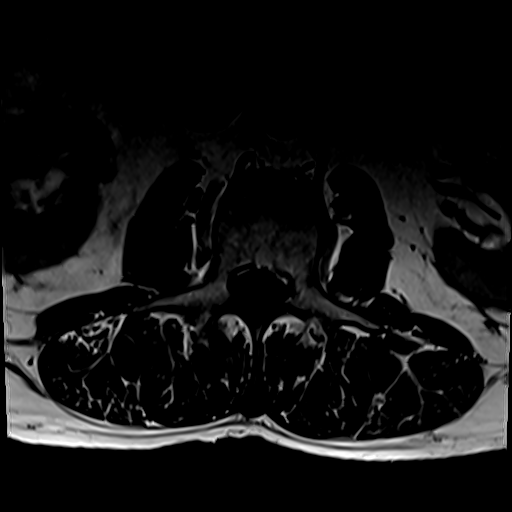
[im 31/31]
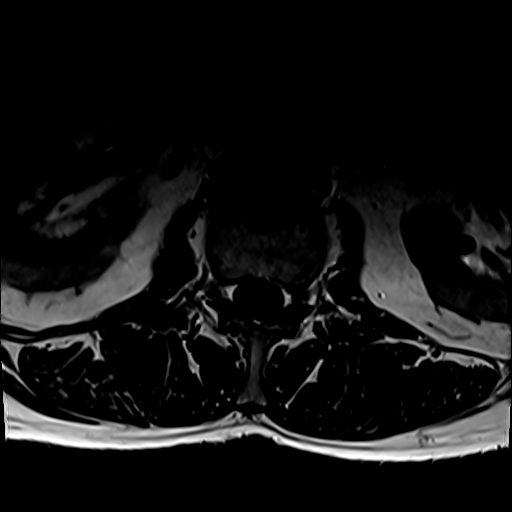

[31 of 48 positions shown; findings below may reference images not displayed]

FINDINGS: Segmentation:  Standard.

Alignment:  Physiologic.

Vertebrae: No acute fracture, evidence of discitis, or aggressive
bone lesion.

Conus medullaris and cauda equina: Conus extends to the T12 level.
Conus and cauda equina appear normal.

Paraspinal and other soft tissues: No acute paraspinal abnormality.

Disc levels:

Disc spaces: Degenerative disease with mild disc height loss
throughout the lumbar spine.

T12-L1: No significant disc bulge. No neural foraminal stenosis. No
central canal stenosis.

L1-L2: Mild broad-based disc bulge with a small left paracentral
disc protrusion. Small central annular fissure. No foraminal
stenosis. Mild bilateral facet arthropathy. Minimal spinal stenosis.

L2-L3: Mild broad-based disc bulge with a small central annular
fissure. Mild bilateral facet arthropathy. No foraminal or central
canal stenosis.

L3-L4: Broad-based disc bulge with a small central disc protrusion.
Moderate bilateral facet arthropathy. Mild spinal stenosis. No
foraminal stenosis.

L4-L5: Broad-based disc bulge with a left paracentral disc
protrusion with mass effect on the left intraspinal L5 nerve root.
Moderate bilateral facet arthropathy. No foraminal stenosis. Mild
spinal stenosis.

L5-S1: Broad-based disc bulge with a small central disc protrusion.
Mild bilateral facet arthropathy. Moderate left and mild right
foraminal stenosis. No spinal stenosis.
IMPRESSION: 1. Lumbar spine spondylosis as described above.
2. No acute osseous injury of the lumbar spine.

## 2022-11-03 DIAGNOSIS — K219 Gastro-esophageal reflux disease without esophagitis: Secondary | ICD-10-CM | POA: Diagnosis not present

## 2022-11-03 DIAGNOSIS — I10 Essential (primary) hypertension: Secondary | ICD-10-CM | POA: Diagnosis not present

## 2022-11-03 DIAGNOSIS — E782 Mixed hyperlipidemia: Secondary | ICD-10-CM | POA: Diagnosis not present

## 2022-11-03 DIAGNOSIS — I700 Atherosclerosis of aorta: Secondary | ICD-10-CM | POA: Diagnosis not present

## 2023-02-16 DIAGNOSIS — R0609 Other forms of dyspnea: Secondary | ICD-10-CM | POA: Diagnosis not present

## 2023-02-16 DIAGNOSIS — R0981 Nasal congestion: Secondary | ICD-10-CM | POA: Diagnosis not present

## 2023-04-27 ENCOUNTER — Encounter: Payer: Self-pay | Admitting: Internal Medicine

## 2023-04-27 ENCOUNTER — Ambulatory Visit: Payer: Self-pay | Attending: Internal Medicine | Admitting: Internal Medicine

## 2023-04-27 VITALS — BP 138/88 | HR 73 | Ht 69.0 in | Wt 190.0 lb

## 2023-04-27 DIAGNOSIS — E785 Hyperlipidemia, unspecified: Secondary | ICD-10-CM | POA: Insufficient documentation

## 2023-04-27 DIAGNOSIS — Z8249 Family history of ischemic heart disease and other diseases of the circulatory system: Secondary | ICD-10-CM | POA: Insufficient documentation

## 2023-04-27 DIAGNOSIS — R0609 Other forms of dyspnea: Secondary | ICD-10-CM | POA: Diagnosis not present

## 2023-04-27 DIAGNOSIS — I10 Essential (primary) hypertension: Secondary | ICD-10-CM | POA: Insufficient documentation

## 2023-04-27 DIAGNOSIS — R931 Abnormal findings on diagnostic imaging of heart and coronary circulation: Secondary | ICD-10-CM | POA: Insufficient documentation

## 2023-04-27 NOTE — Patient Instructions (Signed)
 Medication Instructions:  Your physician recommends that you continue on your current medications as directed. Please refer to the Current Medication list given to you today.   Labwork: Lipoprotein-a to be completed at Lipoprotein-a at [facility]   Testing/Procedures: None  Follow-Up: Your physician recommends that you schedule a follow-up appointment in: Follow up as needed  Any Other Special Instructions Will Be Listed Below (If Applicable). Thank you for choosing [facility]!     If you need a refill on your cardiac medications before your next appointment, please call your pharmacy.

## 2023-04-27 NOTE — Progress Notes (Signed)
 Cardiology Office Note  Date: 04/27/2023   ID: BANNON GIAMMARCO, DOB [birth year], MRN [id number]  PCP:  Georgann Housekeeper, MD  Cardiologist:  Marjo Bicker, MD Electrophysiologist:  None   History of Present Illness: Tracy Goodwin is a 58 y.o. male known to have HTN, HLD was referred to cardiology clinic for evaluation of DOE.  Ongoing DOE for the last 1 month.  He had viral infection/sinus issues at that time.  It resolved after a few days.  No more SOB in the last couple of weeks.  He even was cutting wood and carrying them last week with no issues/symptoms of angina or DOE.  Does not have any other symptoms of dizziness, syncope, palpitations, leg swelling.  Home BPs range around 130 and 140 mmHg SBP.  He has a strong family history of CAD.  Past Medical History:  Diagnosis Date   GERD (gastroesophageal reflux disease)    Hx of seasonal allergies    Hyperlipidemia    Hypertension     Past Surgical History:  Procedure Laterality Date   CATARACT EXTRACTION W/PHACO Right 12/05/2020   Procedure: CATARACT EXTRACTION PHACO AND INTRAOCULAR LENS PLACEMENT (IOC);  Surgeon: Fabio Pierce, MD;  Location: [facility];  Service: Ophthalmology;  Laterality: Right;  CDE 4.64   COLONOSCOPY WITH PROPOFOL N/A 03/18/2015   Procedure: COLONOSCOPY WITH PROPOFOL;  Surgeon: Charolett Bumpers, MD;  Location: [facility];  Service: Endoscopy;  Laterality: N/A;   HAND SURGERY Left    repair tendon"cut repair"    Current Outpatient Medications  Medication Sig Dispense Refill   acetaminophen (TYLENOL) 500 MG tablet Take 1,000 mg by mouth every 6 (six) hours as needed for moderate pain or headache.     amLODipine-valsartan (EXFORGE) 5-320 MG tablet Take 1 tablet by mouth daily.     atorvastatin (LIPITOR) 20 MG tablet Take 20 mg by mouth daily.     Garlic 1000 MG CAPS Take 1,000 mg by mouth daily.     omeprazole (PRILOSEC) 20 MG capsule Take 20 mg by mouth daily as needed.     Potassium 99 MG TABS Take  99 mg by mouth daily.     amLODipine (NORVASC) 5 MG tablet Take 5 mg by mouth daily. (Patient not taking: Reported on 04/27/2023)     gabapentin (NEURONTIN) 100 MG capsule Take 100 mg by mouth in the morning and at bedtime. (Patient not taking: Reported on 04/27/2023)     losartan (COZAAR) 50 MG tablet Take 50 mg by mouth daily. (Patient not taking: Reported on 04/27/2023)     meloxicam (MOBIC) 15 MG tablet Take 15 mg by mouth daily as needed for pain. (Patient not taking: Reported on 04/27/2023)     Menthol-Methyl Salicylate (MUSCLE RUB) 10-15 % CREA Apply 1 application topically as needed for muscle pain. (Patient not taking: Reported on 04/27/2023)     pantoprazole (PROTONIX) 40 MG tablet Take 40 mg by mouth daily. (Patient not taking: Reported on 04/27/2023)  3   simvastatin (ZOCOR) 20 MG tablet Take 20 mg by mouth daily at 6 PM. (Patient not taking: Reported on 04/27/2023)     No current facility-administered medications for this visit.   Allergies:  Patient has no known allergies.   Social History: The patient  reports that he has never smoked. He has never used smokeless tobacco. He reports current alcohol use. He reports that he does not use drugs.   Family History: The patient's family history is not on file.  ROS:  Please see the history of present illness. Otherwise, complete review of systems is positive for none  All other systems are reviewed and negative.   Physical Exam: VS:  BP 138/88   Pulse 73   Ht 5\' 9"  (1.753 m)   Wt 190 lb (86.2 kg)   SpO2 97%   BMI 28.06 kg/m , BMI Body mass index is 28.06 kg/m.  Wt Readings from Last 3 Encounters:  04/27/23 190 lb (86.2 kg)  11/12/20 205 lb (93 kg)  04/27/16 185 lb (83.9 kg)    General: Patient appears comfortable at rest. HEENT: Conjunctiva and lids normal, oropharynx clear with moist mucosa. Neck: Supple, no elevated JVP or carotid bruits, no thyromegaly. Lungs: Clear to auscultation, nonlabored breathing at rest. Cardiac: Regular  rate and rhythm, no S3 or significant systolic murmur, no pericardial rub. Abdomen: Soft, nontender, no hepatomegaly, bowel sounds present, no guarding or rebound. Extremities: No pitting edema, distal pulses 2+. Skin: Warm and dry. Musculoskeletal: No kyphosis. Neuropsychiatric: Alert and oriented x3, affect grossly appropriate.  Recent Labwork: No results found for requested labs within last 365 days.  No results found for: "CHOL", "TRIG", "HDL", "CHOLHDL", "VLDL", "LDLCALC", "LDLDIRECT"   Assessment and Plan:  DOE: Ongoing DOE for the last 1 month but this was around the time he had sinus issues and viral infection.  He does get short of breath around this time of the year due to pollen.  His DOE significantly improved after the viral/sinus issues resolved.  Does not have any more SOB in the last couple of weeks.  He was able to cut wood and carry them last week with no symptoms of angina or DOE.  No further cardiac workup is indicated at this time.  Elevated coronary calcium score 280, 85th percentile for age and sex matched control: Does not have any symptoms of angina or DOE.  Active at baseline.  I discussed with him the symptoms of CAD and MI.  ER precautions for chest pain provided.  HTN, partially controlled: Continue amlodipine-valsartan 5-320 mg once daily.  Goal BP should be less than 130 mmHg SBP.  Can follow-up with PCP.  Home BPs range between 130 and 140 mmHg SBP.  HLD, unknown values: Continue atorvastatin 20 mg at bedtime.  Goal LDL less than 100.  Due to family history of CAD, will obtain lipoprotein a levels.       Medication Adjustments/Labs and Tests Ordered: Current medicines are reviewed at length with the patient today.  Concerns regarding medicines are outlined above.    Disposition:  Follow up prn  Signed Markee Matera Verne Spurr, MD, 04/27/2023 12:55 PM    [facility] [address] [place], [place], [place] [address]

## 2023-05-03 ENCOUNTER — Telehealth: Payer: Self-pay | Admitting: Internal Medicine

## 2023-05-03 NOTE — Telephone Encounter (Signed)
 Patient informed and verbalized understanding of plan.

## 2023-05-03 NOTE — Telephone Encounter (Signed)
Pt is calling to get his lab results. Please advise

## 2023-05-06 ENCOUNTER — Telehealth: Payer: Self-pay

## 2023-05-06 NOTE — Telephone Encounter (Signed)
 Left detailed message per DPR and sent copy to PCP.

## 2023-05-06 NOTE — Telephone Encounter (Signed)
-----   Message from Vishnu P Mallipeddi sent at 05/06/2023  1:45 PM EDT ----- Normal Lp-a

## 2023-08-04 DIAGNOSIS — Z0000 Encounter for general adult medical examination without abnormal findings: Secondary | ICD-10-CM | POA: Diagnosis not present

## 2023-08-04 DIAGNOSIS — E782 Mixed hyperlipidemia: Secondary | ICD-10-CM | POA: Diagnosis not present

## 2023-08-04 DIAGNOSIS — I10 Essential (primary) hypertension: Secondary | ICD-10-CM | POA: Diagnosis not present

## 2023-08-04 DIAGNOSIS — Z23 Encounter for immunization: Secondary | ICD-10-CM | POA: Diagnosis not present
